# Patient Record
Sex: Male | Born: 1965 | Race: Black or African American | Hispanic: No | Marital: Married | Smoking: Never smoker
Health system: Southern US, Community
[De-identification: ages and names within clinical notes are randomized; demographics above are authoritative.]

## PROBLEM LIST (undated history)

## (undated) DIAGNOSIS — N400 Enlarged prostate without lower urinary tract symptoms: Secondary | ICD-10-CM

## (undated) DIAGNOSIS — J301 Allergic rhinitis due to pollen: Secondary | ICD-10-CM

## (undated) DIAGNOSIS — M8909 Algoneurodystrophy, multiple sites: Secondary | ICD-10-CM

## (undated) DIAGNOSIS — E785 Hyperlipidemia, unspecified: Secondary | ICD-10-CM

## (undated) DIAGNOSIS — M47812 Spondylosis without myelopathy or radiculopathy, cervical region: Secondary | ICD-10-CM

## (undated) HISTORY — DX: Hyperlipidemia, unspecified: E78.5

## (undated) HISTORY — DX: Spondylosis without myelopathy or radiculopathy, cervical region: M47.812

## (undated) HISTORY — PX: ANKLE FRACTURE SURGERY: SHX122

## (undated) HISTORY — DX: Algoneurodystrophy, multiple sites: M89.09

## (undated) HISTORY — DX: Benign prostatic hyperplasia without lower urinary tract symptoms: N40.0

## (undated) HISTORY — DX: Allergic rhinitis due to pollen: J30.1

---

## 1998-09-13 ENCOUNTER — Encounter: Payer: Self-pay | Admitting: Emergency Medicine

## 1998-09-13 ENCOUNTER — Encounter: Payer: Self-pay | Admitting: Orthopedic Surgery

## 1998-09-13 ENCOUNTER — Inpatient Hospital Stay (HOSPITAL_COMMUNITY): Admission: EM | Admit: 1998-09-13 | Discharge: 1998-09-14 | Payer: Self-pay | Admitting: Emergency Medicine

## 1998-12-16 ENCOUNTER — Encounter: Admission: RE | Admit: 1998-12-16 | Discharge: 1998-12-29 | Payer: Self-pay | Admitting: Orthopedic Surgery

## 2000-02-22 ENCOUNTER — Encounter: Admission: RE | Admit: 2000-02-22 | Discharge: 2000-02-22 | Payer: Self-pay | Admitting: Internal Medicine

## 2000-02-22 ENCOUNTER — Encounter: Payer: Self-pay | Admitting: Internal Medicine

## 2006-10-13 ENCOUNTER — Encounter: Admission: RE | Admit: 2006-10-13 | Discharge: 2006-10-13 | Payer: Self-pay | Admitting: Internal Medicine

## 2006-11-09 ENCOUNTER — Encounter: Admission: RE | Admit: 2006-11-09 | Discharge: 2006-11-09 | Payer: Self-pay | Admitting: Internal Medicine

## 2008-11-20 IMAGING — CR DG LUMBAR SPINE COMPLETE 4+V
6 series · 6 of 6 positions shown · non-contrast
Comparison: none

CLINICAL DATA: Back pain.  No injury.
 LUMBAR SPINE - 4 VIEW:

[view not recorded (1 of 6)]
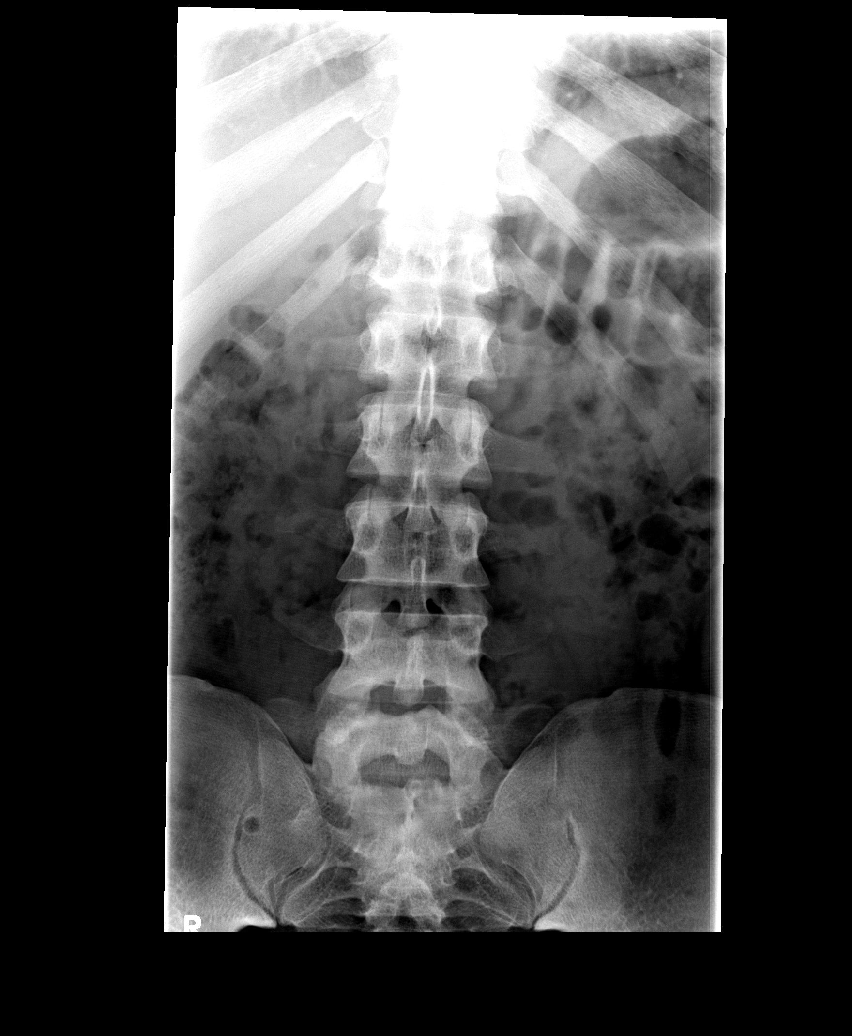

[view not recorded (2 of 6)]
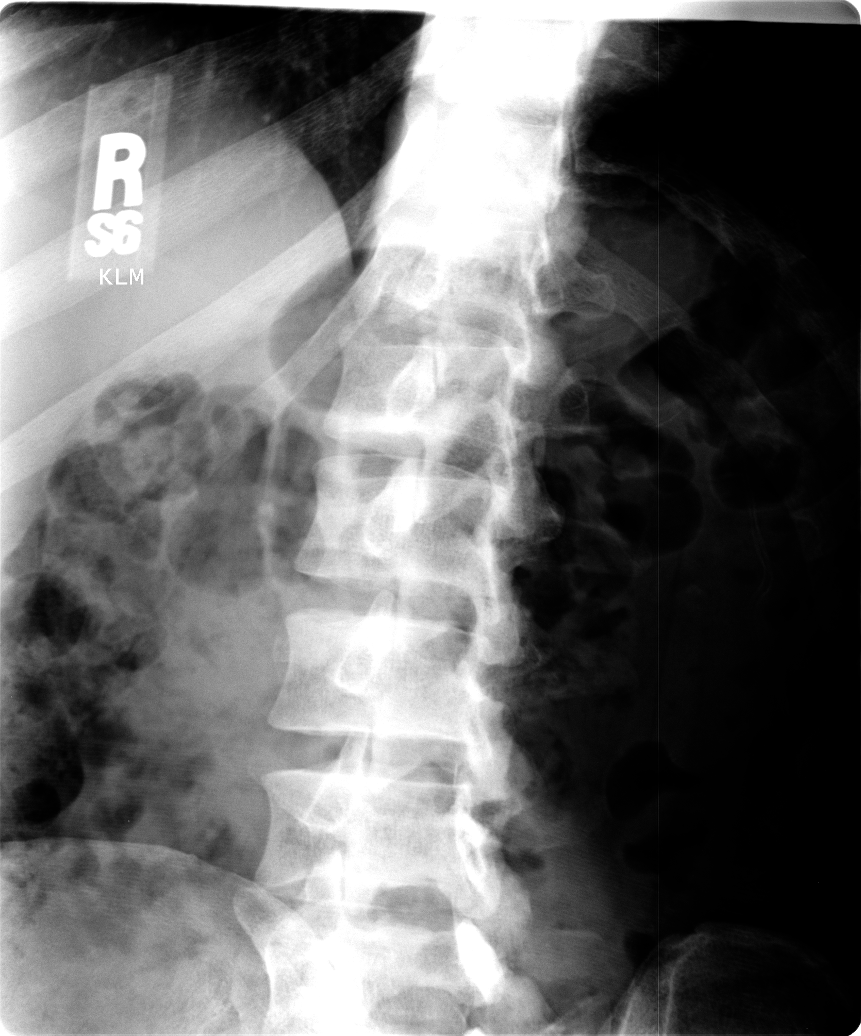

[view not recorded (3 of 6)]
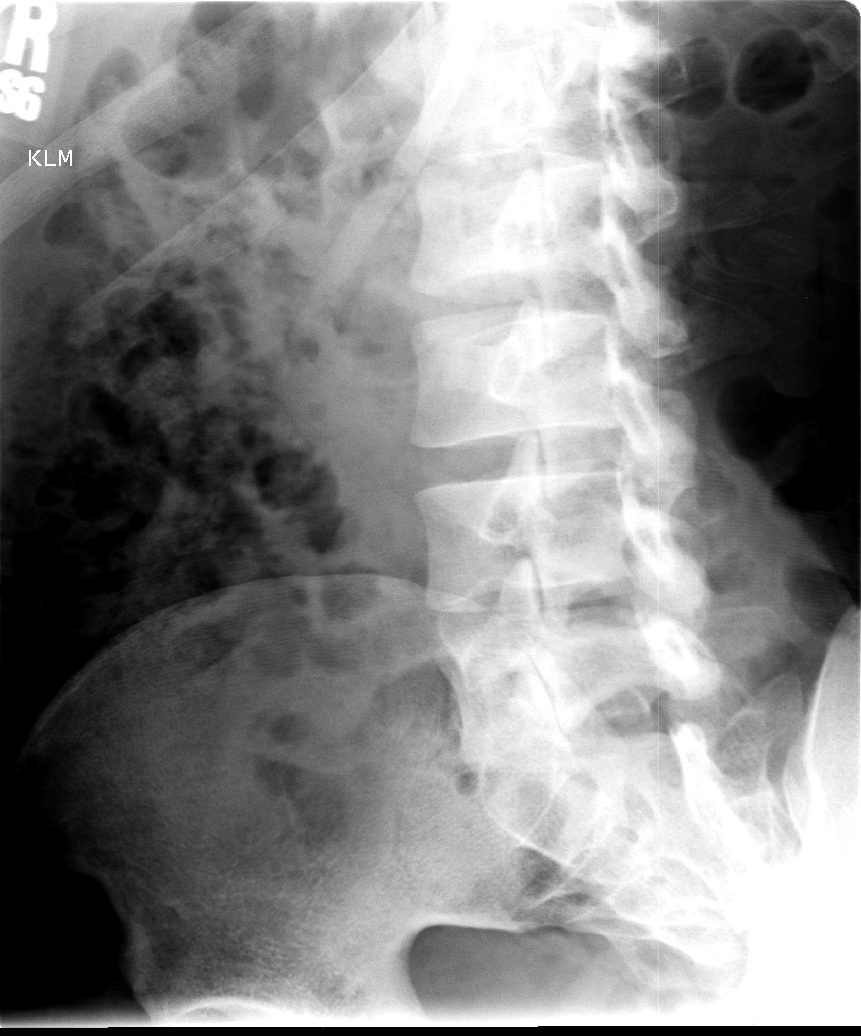

[view not recorded (4 of 6)]
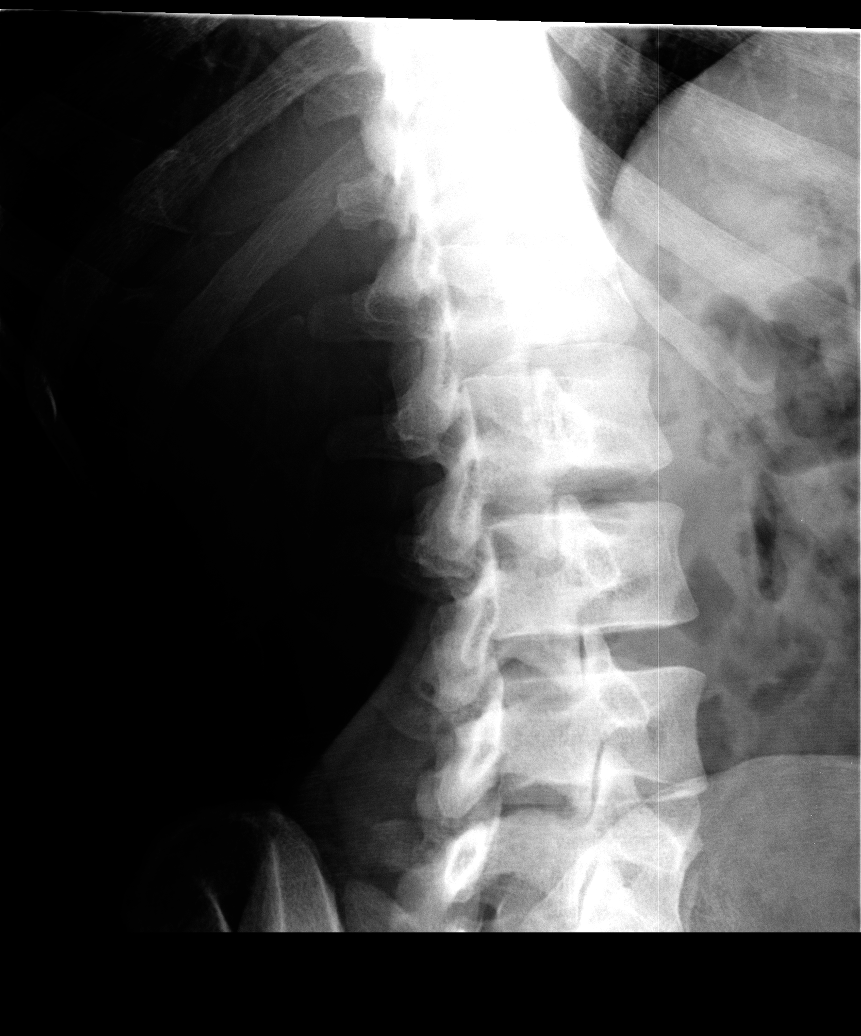

[view not recorded (5 of 6)]
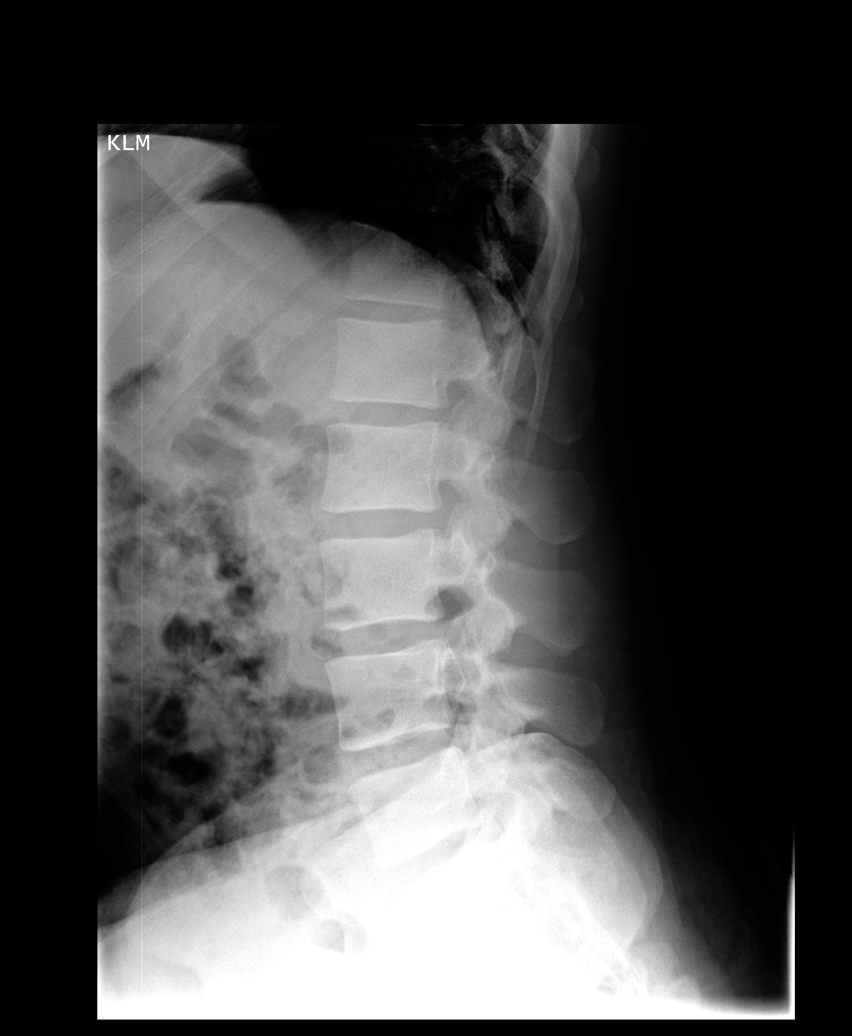

[view not recorded (6 of 6)]
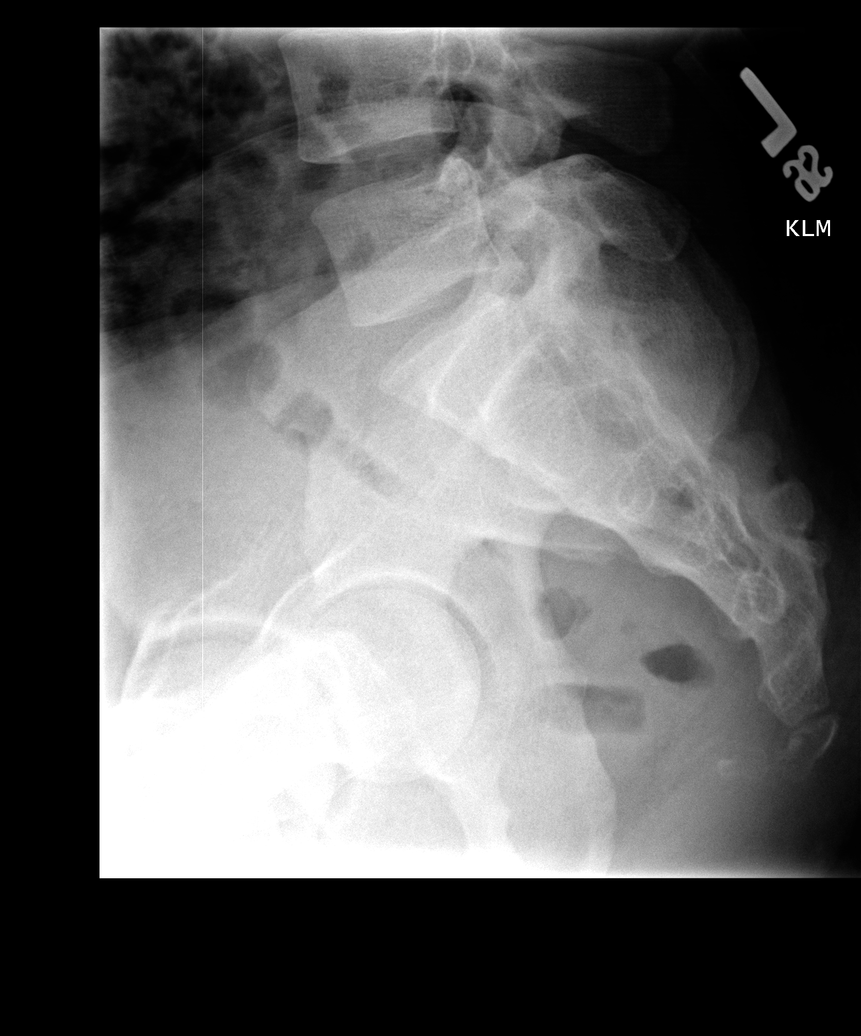

[6 of 6 positions shown; findings below may reference images not displayed]

FINDINGS: Four views of the lumbar spine were obtained.  The lumbar vertebra are in normal alignment with normal disk spaces.  No compression deformity is seen.  On oblique views, there is degenerative change involving the facet joints of the L3-4, L4-5 and L5-S1 levels.  The SI joints appear normal.
IMPRESSION: Normal alignment with normal disk spaces.  Degenerative change involving the facet joints of the lower lumbar spine.

## 2010-05-20 ENCOUNTER — Encounter: Payer: Self-pay | Admitting: Internal Medicine

## 2010-07-06 LAB — LIPID PANEL
HDL: 55 mg/dL (ref 35–70)
LDl/HDL Ratio: 3.3

## 2010-07-06 LAB — BASIC METABOLIC PANEL
BUN: 15 mg/dL (ref 4–21)
Creatinine: 1.2 mg/dL (ref 0.6–1.3)
Glucose: 86 mg/dL
Potassium: 3.8 mmol/L (ref 3.4–5.3)
Sodium: 138 mmol/L (ref 137–147)

## 2010-07-06 LAB — HEPATIC FUNCTION PANEL
ALT: 15 U/L (ref 10–40)
AST: 28 U/L (ref 14–40)
Bilirubin, Total: 0.6 mg/dL

## 2012-02-22 ENCOUNTER — Encounter: Payer: Self-pay | Admitting: Hematology

## 2012-02-22 DIAGNOSIS — N400 Enlarged prostate without lower urinary tract symptoms: Secondary | ICD-10-CM | POA: Insufficient documentation

## 2012-02-22 DIAGNOSIS — M8909 Algoneurodystrophy, multiple sites: Secondary | ICD-10-CM | POA: Insufficient documentation

## 2012-02-22 DIAGNOSIS — M47812 Spondylosis without myelopathy or radiculopathy, cervical region: Secondary | ICD-10-CM | POA: Insufficient documentation

## 2012-04-05 ENCOUNTER — Emergency Department (HOSPITAL_COMMUNITY)
Admission: EM | Admit: 2012-04-05 | Discharge: 2012-04-05 | Disposition: A | Discharge status: Home / Self Care | Payer: BC Managed Care – PPO | Attending: Emergency Medicine | Admitting: Emergency Medicine

## 2012-04-05 ENCOUNTER — Encounter (HOSPITAL_COMMUNITY): Payer: Self-pay | Admitting: Emergency Medicine

## 2012-04-05 DIAGNOSIS — Z87448 Personal history of other diseases of urinary system: Secondary | ICD-10-CM | POA: Insufficient documentation

## 2012-04-05 DIAGNOSIS — Z862 Personal history of diseases of the blood and blood-forming organs and certain disorders involving the immune mechanism: Secondary | ICD-10-CM | POA: Insufficient documentation

## 2012-04-05 DIAGNOSIS — Z23 Encounter for immunization: Secondary | ICD-10-CM | POA: Insufficient documentation

## 2012-04-05 DIAGNOSIS — Y9289 Other specified places as the place of occurrence of the external cause: Secondary | ICD-10-CM | POA: Insufficient documentation

## 2012-04-05 DIAGNOSIS — Z8639 Personal history of other endocrine, nutritional and metabolic disease: Secondary | ICD-10-CM | POA: Insufficient documentation

## 2012-04-05 DIAGNOSIS — Z79899 Other long term (current) drug therapy: Secondary | ICD-10-CM | POA: Insufficient documentation

## 2012-04-05 DIAGNOSIS — W268XXA Contact with other sharp object(s), not elsewhere classified, initial encounter: Secondary | ICD-10-CM | POA: Insufficient documentation

## 2012-04-05 DIAGNOSIS — S81009A Unspecified open wound, unspecified knee, initial encounter: Secondary | ICD-10-CM | POA: Insufficient documentation

## 2012-04-05 DIAGNOSIS — S81809A Unspecified open wound, unspecified lower leg, initial encounter: Secondary | ICD-10-CM | POA: Insufficient documentation

## 2012-04-05 DIAGNOSIS — Y939 Activity, unspecified: Secondary | ICD-10-CM | POA: Insufficient documentation

## 2012-04-05 DIAGNOSIS — J309 Allergic rhinitis, unspecified: Secondary | ICD-10-CM | POA: Insufficient documentation

## 2012-04-05 DIAGNOSIS — Z8739 Personal history of other diseases of the musculoskeletal system and connective tissue: Secondary | ICD-10-CM | POA: Insufficient documentation

## 2012-04-05 DIAGNOSIS — S81832A Puncture wound without foreign body, left lower leg, initial encounter: Secondary | ICD-10-CM

## 2012-04-05 MED ORDER — TETANUS-DIPHTH-ACELL PERTUSSIS 5-2.5-18.5 LF-MCG/0.5 IM SUSP
0.5000 mL | Freq: Once | INTRAMUSCULAR | Status: AC
  Administered 2012-04-05: 0.5 mL via INTRAMUSCULAR
  Filled 2012-04-05: qty 0.5

## 2012-04-05 NOTE — ED Notes (Signed)
Pt presents to the ED with a wound puncture and bleeding to L leg.  Pt reports that while unloading a [employer]haul truck he caught his L leg on the truck.  Bleeding controlled in triage.  Pt alert and oriented and in no distress.  Denies pain or falling.

## 2012-04-05 NOTE — ED Notes (Signed)
[id number]<AV> Expected date:<BR> Expected time:<BR> Means of arrival:<BR> Comments:<BR> Ems/ leg pain with IV

## 2012-04-05 NOTE — ED Provider Notes (Signed)
History     CSN: [id number]  Arrival date & time 04/05/12  1151   First MD Initiated Contact with Patient 04/05/12 1225      Chief Complaint  Patient presents with  . Leg Injury  . Extremity Laceration    (Consider location/radiation/quality/duration/timing/severity/associated sxs/prior treatment) HPI Dale Moore is a 47 y.o. male who presents to ED with complaint of puncture wound and bleeding from left lower leg. States hit his leg on sharp edge of trailer. Puncture wound that was "squirting blood." states unable to stop bleeding with pressure. Unknown tetanus. No weakness or numbness distal to the injury. Pressure applied at time of accident.    Past Medical History  Diagnosis Date  . Hyperlipidemia   . Allergic rhinitis due to pollen   . DJD (degenerative joint disease), cervical   . Shoulder-hand syndrome   . BPH (benign prostatic hyperplasia)     History reviewed. No pertinent past surgical history.  No family history on file.  History  Substance Use Topics  . Smoking status: Never Smoker   . Smokeless tobacco: Not on file  . Alcohol Use: Not on file      Review of Systems  Constitutional: Negative for fever and chills.  Respiratory: Negative.   Cardiovascular: Negative.   Skin: Positive for wound.  Hematological: Does not bruise/bleed easily.    Allergies  Review of patient's allergies indicates no known allergies.  Home Medications   Current Outpatient Rx  Name  Route  Sig  Dispense  Refill  . diphenhydrAMINE (BENADRYL) 25 mg capsule   Oral   Take 25 mg by mouth every 6 (six) hours as needed.         Marland Kitchen levocetirizine (XYZAL) 5 MG tablet   Oral   Take 5 mg by mouth daily.           . naproxen sodium (ANAPROX) 220 MG tablet   Oral   Take 220 mg by mouth as needed.           BP 124/55  Pulse 73  Temp(Src) 98.4 F (36.9 C) (Oral)  Resp 20  SpO2 100%  Physical Exam  Nursing note and vitals reviewed. Constitutional: He is  oriented to person, place, and time. He appears well-developed and well-nourished. No distress.  Cardiovascular: Normal rate, regular rhythm and normal heart sounds.   Pulmonary/Chest: Effort normal and breath sounds normal. No respiratory distress. He has no wheezes. He has no rales.  Musculoskeletal:  Full rom of left ankle and toes. 5/5 strength with resistance in all directions. Dorsal pendal pulses normal.  Neurological: He is alert and oriented to person, place, and time.  Skin: Skin is warm and dry.  1cm puncture wound to the left anterior mid shin. Bleeding.     ED Course  Procedures (including critical care time)  LACERATION REPAIR Performed by: Lottie Mussel Authorized by: Jaynie Crumble A Consent: Verbal consent obtained. Risks and benefits: risks, benefits and alternatives were discussed Consent given by: patient Patient identity confirmed: provided demographic data Prepped and Draped in normal sterile fashion Wound explored  Laceration Location: left anterior mid shin  Laceration Length: 1cm  No Foreign Bodies seen or palpated  Anesthesia: local infiltration  Local anesthetic: lidocaine 2% w epinephrine  Anesthetic total: 2 ml  Irrigation method: syringe and 18 gauge needle Amount of cleaning: extensive  Skin closure: prolene 4.0  Number of sutures: 1  Technique: simple interrupted  Patient tolerance: Patient tolerated the procedure well with  no immediate complications.   1. Puncture wound of left lower leg, initial encounter       MDM  Pt with puncture wound to left anterior shin. Bleeding on presentation. Suspect a venous bleed. Wound irrigated thoroughly. I did put a stitch to pul the wound together to help to stop the bleeding. Wound hemostatic now. Tetanus updated. Pt will be d/c home with follow up. Instructed to watch for sighs of infection.         Lottie Mussel, PA 04/05/12 1640

## 2012-04-07 NOTE — ED Provider Notes (Signed)
Medical screening examination/treatment/procedure(s) were performed by non-physician practitioner and as supervising physician I was immediately available for consultation/collaboration.  Christopher J. Pollina, MD 04/07/12 1323 

## 2012-07-01 ENCOUNTER — Emergency Department
Admission: EM | Admit: 2012-07-01 | Discharge: 2012-07-01 | Disposition: A | Discharge status: Home / Self Care | Payer: BC Managed Care – PPO | Source: Home / Self Care | Attending: Family Medicine | Admitting: Family Medicine

## 2012-07-01 DIAGNOSIS — T6391XA Toxic effect of contact with unspecified venomous animal, accidental (unintentional), initial encounter: Secondary | ICD-10-CM

## 2012-07-01 DIAGNOSIS — T7840XA Allergy, unspecified, initial encounter: Secondary | ICD-10-CM

## 2012-07-01 MED ORDER — METHYLPREDNISOLONE SODIUM SUCC 125 MG IJ SOLR
125.0000 mg | Freq: Once | INTRAMUSCULAR | Status: AC
  Administered 2012-07-01: 125 mg via INTRAMUSCULAR

## 2012-07-01 MED ORDER — PREDNISONE 50 MG PO TABS: ORAL_TABLET | ORAL | Status: AC

## 2012-07-01 MED ORDER — HYDROXYZINE HCL 25 MG PO TABS: 25.0000 mg | ORAL_TABLET | Freq: Three times a day (TID) | ORAL | Status: AC | PRN

## 2012-07-01 MED ORDER — EPINEPHRINE 0.3 MG/0.3ML IJ SOAJ: 0.3000 mg | Freq: Once | INTRAMUSCULAR | Status: AC

## 2012-07-01 NOTE — ED Provider Notes (Signed)
History     CSN: [id number]  Arrival date & time 07/01/12  1656   First MD Initiated Contact with Patient 07/01/12 1701      Chief Complaint  Patient presents with  . Insect Bite    yesterday   HPI  Bee sting on L hand yesterday Initially had some mild itching and irritation  Has progressed to have L hand swelling and worsening itching  No SOB, wheezing, trouble swallowing.  Had bee sting 7 years ago. Sxs were milder at that time.  No joint stiffness or pain.   Past Medical History  Diagnosis Date  . Hyperlipidemia   . Allergic rhinitis due to pollen   . DJD (degenerative joint disease), cervical   . Shoulder-hand syndrome   . BPH (benign prostatic hyperplasia)     History reviewed. No pertinent past surgical history.  History reviewed. No pertinent family history.  History  Substance Use Topics  . Smoking status: Never Smoker   . Smokeless tobacco: Not on file  . Alcohol Use: No      Review of Systems  All other systems reviewed and are negative.    Allergies  Review of patient's allergies indicates no known allergies.  Home Medications   Current Outpatient Rx  Name  Route  Sig  Dispense  Refill  . diphenhydrAMINE (BENADRYL) 25 mg capsule   Oral   Take 25 mg by mouth every 6 (six) hours as needed.         Marland Kitchen EPINEPHrine (EPIPEN) 0.3 mg/0.3 mL DEVI   Intramuscular   Inject 0.3 mLs (0.3 mg total) into the muscle once.   1 Device   0   . hydrOXYzine (ATARAX/VISTARIL) 25 MG tablet   Oral   Take 1 tablet (25 mg total) by mouth 3 (three) times daily as needed for itching.   30 tablet   0   . levocetirizine (XYZAL) 5 MG tablet   Oral   Take 5 mg by mouth daily.           . naproxen sodium (ANAPROX) 220 MG tablet   Oral   Take 220 mg by mouth as needed.         . predniSONE (DELTASONE) 50 MG tablet      1 tab daily x 5 days   5 tablet   0     BP 97/58  Pulse 59  Temp(Src) 98 F (36.7 C) (Oral)  Ht 5\' 8"  (1.727 m)  Wt 210 lb  (95.255 kg)  BMI 31.94 kg/m2  SpO2 97%  Physical Exam  Constitutional: He appears well-developed and well-nourished.  HENT:  Head: Normocephalic and atraumatic.  Eyes: Conjunctivae are normal. Pupils are equal, round, and reactive to light.  Neck: Normal range of motion. Neck supple.  Cardiovascular: Normal rate and regular rhythm.   Pulmonary/Chest: Effort normal and breath sounds normal.  Abdominal: Soft.  Musculoskeletal: Normal range of motion.  Neurological: He is alert.  Skin: Rash noted.    ED Course  Procedures (including critical care time)  Labs Reviewed - No data to display No results found.   1. Bee sting, initial encounter   2. Allergic reaction, initial encounter       MDM  Fairly mild flare today Solumedrol 125mg  IM x1 Will place on course of prednisone and atarax.  Rx for epipen also given in case of another bee sting.  Discussed anaphylactic/resp  red flags Follow up as needed.     The patient and/or caregiver  has been counseled thoroughly with regard to treatment plan and/or medications prescribed including dosage, schedule, interactions, rationale for use, and possible side effects and they verbalize understanding. Diagnoses and expected course of recovery discussed and will return if not improved as expected or if the condition worsens. Patient and/or caregiver verbalized understanding.             Doree Albee, MD 07/01/12 [phone]

## 2012-07-01 NOTE — ED Notes (Addendum)
Dale Moore was stung yesterday by a bee, on his left arm. Today the area itches is red and swollen.

## 2013-05-07 ENCOUNTER — Encounter (HOSPITAL_BASED_OUTPATIENT_CLINIC_OR_DEPARTMENT_OTHER): Payer: Self-pay | Admitting: Emergency Medicine

## 2013-05-07 ENCOUNTER — Emergency Department (HOSPITAL_BASED_OUTPATIENT_CLINIC_OR_DEPARTMENT_OTHER)
Admission: EM | Admit: 2013-05-07 | Discharge: 2013-05-08 | Disposition: A | Discharge status: Home / Self Care | Payer: Managed Care, Other (non HMO) | Attending: Emergency Medicine | Admitting: Emergency Medicine

## 2013-05-07 DIAGNOSIS — Z8669 Personal history of other diseases of the nervous system and sense organs: Secondary | ICD-10-CM | POA: Insufficient documentation

## 2013-05-07 DIAGNOSIS — Z87448 Personal history of other diseases of urinary system: Secondary | ICD-10-CM | POA: Insufficient documentation

## 2013-05-07 DIAGNOSIS — M5030 Other cervical disc degeneration, unspecified cervical region: Secondary | ICD-10-CM | POA: Insufficient documentation

## 2013-05-07 DIAGNOSIS — K2970 Gastritis, unspecified, without bleeding: Secondary | ICD-10-CM | POA: Insufficient documentation

## 2013-05-07 DIAGNOSIS — K2990 Gastroduodenitis, unspecified, without bleeding: Principal | ICD-10-CM

## 2013-05-07 DIAGNOSIS — E785 Hyperlipidemia, unspecified: Secondary | ICD-10-CM | POA: Insufficient documentation

## 2013-05-07 MED ORDER — ONDANSETRON HCL 4 MG/2ML IJ SOLN
4.0000 mg | Freq: Once | INTRAMUSCULAR | Status: AC
Start: 2013-05-08 — End: 2013-05-08
  Administered 2013-05-08: 4 mg via INTRAVENOUS
  Filled 2013-05-07: qty 2

## 2013-05-07 MED ORDER — HYDROMORPHONE HCL PF 1 MG/ML IJ SOLN
1.0000 mg | Freq: Once | INTRAMUSCULAR | Status: AC
  Administered 2013-05-08: 1 mg via INTRAVENOUS
  Filled 2013-05-07: qty 1

## 2013-05-07 MED ORDER — FAMOTIDINE IN NACL 20-0.9 MG/50ML-% IV SOLN
20.0000 mg | Freq: Once | INTRAVENOUS | Status: AC
  Administered 2013-05-08: 20 mg via INTRAVENOUS
  Filled 2013-05-07: qty 50

## 2013-05-07 MED ORDER — SODIUM CHLORIDE 0.9 % IV SOLN
INTRAVENOUS | Status: DC
  Administered 2013-05-08: via INTRAVENOUS

## 2013-05-07 NOTE — ED Provider Notes (Signed)
CSN: [id number]     Arrival date & time 05/07/13  2334 History  This chart was scribed for Hanley Seamen, MD by Dorothey Baseman, ED Scribe. This patient was seen in room MH03/MH03 and the patient's care was started at 11:48 PM.    Chief Complaint  Patient presents with  . Abdominal Pain   The history is provided by the patient. No language interpreter was used.   HPI Comments: Dale Moore is a 48 y.o. male who presents to the Emergency Department complaining of an intermittent, burning pain to the epigastric region of the abdomen, 10/10 currently, onset about 10-11 hours ago. He reports associated nausea, vomiting, and diarrhea. Patient reports that he has been on a fasting diet (fruits, vegetables, nuts, seeds, water) and has been taking Metamucil 3 times a day for the past 3 weeks. He states that he has had acid reflux in the past, but that his current pain is much more severe. Patient has a history of hyperlipidemia and benign prostatic hyperplasia. Patient does not drink alcohol.   Past Medical History  Diagnosis Date  . Hyperlipidemia   . Allergic rhinitis due to pollen   . DJD (degenerative joint disease), cervical   . Shoulder-hand syndrome   . BPH (benign prostatic hyperplasia)    Past Surgical History  Procedure Laterality Date  . Ankle fracture surgery     No family history on file. History  Substance Use Topics  . Smoking status: Never Smoker   . Smokeless tobacco: Not on file  . Alcohol Use: No    Review of Systems  A complete 10 system review of systems was obtained and all systems are negative except as noted in the HPI and PMH.    Allergies  Review of patient's allergies indicates no known allergies.  Home Medications   Current Outpatient Rx  Name  Route  Sig  Dispense  Refill  . diphenhydrAMINE (BENADRYL) 25 mg capsule   Oral   Take 25 mg by mouth every 6 (six) hours as needed.         Marland Kitchen EPINEPHrine (EPIPEN) 0.3 mg/0.3 mL DEVI   Intramuscular   Inject  0.3 mLs (0.3 mg total) into the muscle once.   1 Device   0   . hydrOXYzine (ATARAX/VISTARIL) 25 MG tablet   Oral   Take 1 tablet (25 mg total) by mouth 3 (three) times daily as needed for itching.   30 tablet   0   . levocetirizine (XYZAL) 5 MG tablet   Oral   Take 5 mg by mouth daily.           . naproxen sodium (ANAPROX) 220 MG tablet   Oral   Take 220 mg by mouth as needed.         . predniSONE (DELTASONE) 50 MG tablet      1 tab daily x 5 days   5 tablet   0    Triage Vitals: BP 126/82  Pulse 82  Temp(Src) 98.4 F (36.9 C) (Oral)  Resp 20  Ht 5\' 8"  (1.727 m)  Wt 210 lb (95.255 kg)  BMI 31.94 kg/m2  SpO2 100%  Physical Exam  Nursing note and vitals reviewed. Constitutional: He is oriented to person, place, and time. He appears well-developed and well-nourished. No distress.  HENT:  Head: Normocephalic and atraumatic.  Eyes: Conjunctivae are normal.  Neck: Normal range of motion. Neck supple.  Cardiovascular: Normal rate, regular rhythm and normal heart sounds.  Pulmonary/Chest: Effort normal and breath sounds normal. No respiratory distress.  Abdominal: Soft. Bowel sounds are normal. He exhibits no distension and no mass. There is tenderness. There is no rebound and no guarding.  Epigastric and LUQ tenderness. No hepatosplenomegaly.   Musculoskeletal: Normal range of motion.  Neurological: He is alert and oriented to person, place, and time.  Skin: Skin is warm and dry.  Psychiatric: He has a normal mood and affect. His behavior is normal.    ED Course  Procedures (including critical care time)  DIAGNOSTIC STUDIES: Oxygen Saturation is 100% on room air, normal by my interpretation.    COORDINATION OF CARE: 11:52 PM- Ordered CMP, lipase, CBC. Ordered IV fluids, Zofran, Dilaudid, and Pepcid to manage symptoms. Discussed treatment plan with patient at bedside and patient verbalized agreement.    MDM   Nursing notes and vitals signs, including  pulse oximetry, reviewed.  Summary of this visit's results, reviewed by myself:  Labs:  Results for orders placed during the hospital encounter of 05/07/13 (from the past 24 hour(s))  LIPASE, BLOOD     Status: None   Collection Time    05/08/13  1:29 AM      Result Value Ref Range   Lipase 11  11 - 59 U/L  CBC WITH DIFFERENTIAL     Status: Abnormal   Collection Time    05/08/13  1:29 AM      Result Value Ref Range   WBC 5.5  4.0 - 10.5 K/uL   RBC 5.54  4.22 - 5.81 MIL/uL   Hemoglobin 13.6  13.0 - 17.0 g/dL   HCT 16.140.2  09.639.0 - 04.552.0 %   MCV 72.6 (*) 78.0 - 100.0 fL   MCH 24.5 (*) 26.0 - 34.0 pg   MCHC 33.8  30.0 - 36.0 g/dL   RDW 40.915.6 (*) 81.111.5 - 91.415.5 %   Platelets 247  150 - 400 K/uL   Neutrophils Relative % 65  43 - 77 %   Neutro Abs 3.6  1.7 - 7.7 K/uL   Lymphocytes Relative 26  12 - 46 %   Lymphs Abs 1.4  0.7 - 4.0 K/uL   Monocytes Relative 8  3 - 12 %   Monocytes Absolute 0.4  0.1 - 1.0 K/uL   Eosinophils Relative 1  0 - 5 %   Eosinophils Absolute 0.0  0.0 - 0.7 K/uL   Basophils Relative 0  0 - 1 %   Basophils Absolute 0.0  0.0 - 0.1 K/uL  COMPREHENSIVE METABOLIC PANEL     Status: Abnormal   Collection Time    05/08/13  1:29 AM      Result Value Ref Range   Sodium 139  137 - 147 mEq/L   Potassium 4.5  3.7 - 5.3 mEq/L   Chloride 101  96 - 112 mEq/L   CO2 25  19 - 32 mEq/L   Glucose, Bld 131 (*) 70 - 99 mg/dL   BUN 7  6 - 23 mg/dL   Creatinine, Ser 7.821.00  0.50 - 1.35 mg/dL   Calcium 9.3  8.4 - 95.610.5 mg/dL   Total Protein 7.3  6.0 - 8.3 g/dL   Albumin 4.1  3.5 - 5.2 g/dL   AST 25  0 - 37 U/L   ALT 20  0 - 53 U/L   Alkaline Phosphatase 60  39 - 117 U/L   Total Bilirubin 0.5  0.3 - 1.2 mg/dL   GFR calc non Af Amer 87 (*) >  90 mL/min   GFR calc Af Amer >90  >90 mL/min   3:30 AM Patient's pain is relieved after IV medications and Carafate by mouth. There is no evidence of acute pancreatitis or hepatobiliary disease at this time. We will treat him for gastritis.  I  personally performed the services described in this documentation, which was scribed in my presence. The recorded information has been reviewed and is accurate.     Hanley Seamen, MD 05/08/13 0330

## 2013-05-07 NOTE — ED Notes (Signed)
Epigastric pain since 1pm.  Vomiting and diarrhea.  Pt sts he has been taking Metamucil 3 times a day x3 weeks and has been observing a fast that includes fruits, vegetables, nuts, seeds, and water.

## 2013-05-08 LAB — COMPREHENSIVE METABOLIC PANEL
ALBUMIN: 4.1 g/dL (ref 3.5–5.2)
ALT: 20 U/L (ref 0–53)
AST: 25 U/L (ref 0–37)
Alkaline Phosphatase: 60 U/L (ref 39–117)
BUN: 7 mg/dL (ref 6–23)
CALCIUM: 9.3 mg/dL (ref 8.4–10.5)
CO2: 25 mEq/L (ref 19–32)
Chloride: 101 mEq/L (ref 96–112)
Creatinine, Ser: 1 mg/dL (ref 0.50–1.35)
GFR calc Af Amer: >90 mL/min (ref >90)
GFR calc non Af Amer: 87 mL/min — ABNORMAL LOW (ref >90)
Glucose, Bld: 131 mg/dL — ABNORMAL HIGH (ref 70–99)
Potassium: 4.5 mEq/L (ref 3.7–5.3)
SODIUM: 139 meq/L (ref 137–147)
TOTAL PROTEIN: 7.3 g/dL (ref 6.0–8.3)
Total Bilirubin: 0.5 mg/dL (ref 0.3–1.2)

## 2013-05-08 LAB — CBC WITH DIFFERENTIAL/PLATELET
BASOS ABS: 0 10*3/uL (ref 0.0–0.1)
Basophils Relative: 0 % (ref 0–1)
EOS ABS: 0 10*3/uL (ref 0.0–0.7)
EOS PCT: 1 % (ref 0–5)
HCT: 40.2 % (ref 39.0–52.0)
Hemoglobin: 13.6 g/dL (ref 13.0–17.0)
LYMPHS PCT: 26 % (ref 12–46)
Lymphs Abs: 1.4 10*3/uL (ref 0.7–4.0)
MCH: 24.5 pg — AB (ref 26.0–34.0)
MCHC: 33.8 g/dL (ref 30.0–36.0)
MCV: 72.6 fL — AB (ref 78.0–100.0)
Monocytes Absolute: 0.4 10*3/uL (ref 0.1–1.0)
Monocytes Relative: 8 % (ref 3–12)
Neutro Abs: 3.6 10*3/uL (ref 1.7–7.7)
Neutrophils Relative %: 65 % (ref 43–77)
PLATELETS: 247 10*3/uL (ref 150–400)
RBC: 5.54 MIL/uL (ref 4.22–5.81)
RDW: 15.6 % — AB (ref 11.5–15.5)
WBC: 5.5 10*3/uL (ref 4.0–10.5)

## 2013-05-08 LAB — LIPASE, BLOOD: Lipase: 11 U/L (ref 11–59)

## 2013-05-08 MED ORDER — OMEPRAZOLE 40 MG PO CPDR: 40.0000 mg | DELAYED_RELEASE_CAPSULE | Freq: Every day | ORAL | Status: AC

## 2013-05-08 MED ORDER — SUCRALFATE 1 G PO TABS: 1.0000 g | ORAL_TABLET | Freq: Three times a day (TID) | ORAL | Status: AC

## 2013-05-08 MED ORDER — SUCRALFATE 1 G PO TABS
1.0000 g | ORAL_TABLET | Freq: Once | ORAL | Status: AC
  Administered 2013-05-08: 1 g via ORAL
  Filled 2013-05-08: qty 1

## 2013-05-08 NOTE — Discharge Instructions (Signed)

## 2014-08-08 DEATH — deceased

## 2021-12-28 DIAGNOSIS — H179 Unspecified corneal scar and opacity: Secondary | ICD-10-CM | POA: Diagnosis not present

## 2021-12-28 DIAGNOSIS — H11153 Pinguecula, bilateral: Secondary | ICD-10-CM | POA: Diagnosis not present

## 2021-12-28 DIAGNOSIS — H524 Presbyopia: Secondary | ICD-10-CM | POA: Diagnosis not present

## 2021-12-28 DIAGNOSIS — H25013 Cortical age-related cataract, bilateral: Secondary | ICD-10-CM | POA: Diagnosis not present

## 2021-12-28 DIAGNOSIS — H2513 Age-related nuclear cataract, bilateral: Secondary | ICD-10-CM | POA: Diagnosis not present

## 2022-04-01 DIAGNOSIS — Z0000 Encounter for general adult medical examination without abnormal findings: Secondary | ICD-10-CM | POA: Diagnosis not present

## 2022-04-01 DIAGNOSIS — E782 Mixed hyperlipidemia: Secondary | ICD-10-CM | POA: Diagnosis not present

## 2022-04-01 DIAGNOSIS — M5090 Cervical disc disorder, unspecified, unspecified cervical region: Secondary | ICD-10-CM | POA: Diagnosis not present

## 2022-04-01 DIAGNOSIS — E559 Vitamin D deficiency, unspecified: Secondary | ICD-10-CM | POA: Diagnosis not present

## 2022-04-01 DIAGNOSIS — J3089 Other allergic rhinitis: Secondary | ICD-10-CM | POA: Diagnosis not present

## 2022-04-01 DIAGNOSIS — R7303 Prediabetes: Secondary | ICD-10-CM | POA: Diagnosis not present

## 2022-04-01 DIAGNOSIS — B351 Tinea unguium: Secondary | ICD-10-CM | POA: Diagnosis not present

## 2023-03-31 DIAGNOSIS — J3089 Other allergic rhinitis: Secondary | ICD-10-CM | POA: Diagnosis not present

## 2023-03-31 DIAGNOSIS — E782 Mixed hyperlipidemia: Secondary | ICD-10-CM | POA: Diagnosis not present

## 2023-03-31 DIAGNOSIS — Z125 Encounter for screening for malignant neoplasm of prostate: Secondary | ICD-10-CM | POA: Diagnosis not present

## 2023-03-31 DIAGNOSIS — B351 Tinea unguium: Secondary | ICD-10-CM | POA: Diagnosis not present

## 2023-03-31 DIAGNOSIS — E559 Vitamin D deficiency, unspecified: Secondary | ICD-10-CM | POA: Diagnosis not present

## 2023-03-31 DIAGNOSIS — M5090 Cervical disc disorder, unspecified, unspecified cervical region: Secondary | ICD-10-CM | POA: Diagnosis not present

## 2023-03-31 DIAGNOSIS — Z0000 Encounter for general adult medical examination without abnormal findings: Secondary | ICD-10-CM | POA: Diagnosis not present
